# Patient Record
Sex: Female | Born: 1990 | Race: Black or African American | Hispanic: No | Marital: Single | Smoking: Never smoker
Health system: Southern US, Community
[De-identification: ages and names within clinical notes are randomized; demographics above are authoritative.]

## PROBLEM LIST (undated history)

## (undated) DIAGNOSIS — D649 Anemia, unspecified: Secondary | ICD-10-CM

---
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
## 2013-07-26 ENCOUNTER — Encounter: Payer: Self-pay | Admitting: Podiatry

## 2013-07-26 ENCOUNTER — Ambulatory Visit (INDEPENDENT_AMBULATORY_CARE_PROVIDER_SITE_OTHER): Payer: 59 | Admitting: Podiatry

## 2013-07-26 ENCOUNTER — Ambulatory Visit (INDEPENDENT_AMBULATORY_CARE_PROVIDER_SITE_OTHER): Payer: 59

## 2013-07-26 VITALS — BP 125/84 | HR 91 | Resp 16 | Ht 61.0 in | Wt 138.0 lb

## 2013-07-26 DIAGNOSIS — M21619 Bunion of unspecified foot: Secondary | ICD-10-CM

## 2013-07-26 NOTE — Progress Notes (Signed)
**Note Erika-Identified via Obfuscation**    Subjective:    Patient ID: Erika Frederick, female    DOB: [birth year], 23 y.o.   MRN: [id number]  HPI Comments: i have a bunion on my right foot. Ive had it for over 1 year. It has gotten worse. i have used a bunion remover on it. It helped a little while then the pain came back. It hurts to walk on it and have tennis shoes on.  Foot Pain      Review of Systems  All other systems reviewed and are negative.      Objective:   Physical Exam: I have reviewed her past medical history medications allergies surgeries social history and review of systems. Pulses are strongly palpable bilateral. Neurologic sensorium is intact per since once the monofilament. Deep tendon reflexes are intact bilateral muscle strength is 5 over 5 dorsiflexors plantar flexors inverters everters all intrinsic musculature is intact. Orthopedic evaluation demonstrates all joints distal to the ankle a full range of motion without crepitation. She does have pain on direct palpation of the dorsal lateral aspect of the first metatarsophalangeal joint and the medial aspect of the first metatarsal head distally associated with mild hallux abductovalgus deformity. Radiographic evaluation does demonstrate a very mild increase in the first metatarsal angle the joint space narrowing laterally and dorsal laterally indicative of a functional hallux limitus. Cutaneous evaluation demonstrates supple well hydrated cutis there is no erythema edema saline is drainage or odor.        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  Plan: We discussed the etiology pathology conservative versus surgical therapies. At this point she would like to have this surgically corrected with an Erika Frederick a type osteotomy. However she would like to have this done in December and we will followup with her in October for surgical consult regarding this right foot.

## 2013-12-27 ENCOUNTER — Ambulatory Visit (INDEPENDENT_AMBULATORY_CARE_PROVIDER_SITE_OTHER): Payer: 59 | Admitting: Podiatry

## 2013-12-27 VITALS — BP 124/77 | HR 73 | Resp 16

## 2013-12-27 DIAGNOSIS — M2011 Hallux valgus (acquired), right foot: Secondary | ICD-10-CM

## 2013-12-27 DIAGNOSIS — M21611 Bunion of right foot: Secondary | ICD-10-CM

## 2013-12-27 NOTE — Progress Notes (Signed)
She presents today for a surgical consult regarding her right foot. She has tried to tolerate this and has tried different shoe gear to no avail. She states that he continues to her in a starting to affect her ability to perform her daily routine including work. She like to have this surgically repaired at all possible.  Objective: Signs are stable she is alert and oriented x3. Pulses are strongly palpable right foot. Pain on palpation and sharp dorsiflexion of the first metatarsophalangeal joint of the right foot with mild to moderate increase in the first intermetatarsal angle upon reviewing radiographs. Radiographs also demonstrate a dorsal condyle that appears to be developing an early spur possibly associated with a functional hallux limitus. Hypermobility of the first metatarsal me cuneiform joint in the sagittal plane is evident.  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 Chronic pain right foot.  Plan: Discussed etiology pathology conservative versus surgical therapies. At this point we went over consent form today line bylined number by number giving her ample time to ask questions she saw fit regarding an [place] osteotomy with screw right foot. I answered all the questions regarding his procedures to the best of my ability in layman's terms. We discussed the possible postop complications which may include but are not limited to postop pain bleeding swelling infection recurrence of the similar condition over correction under correction need for further surgery loss of digit loss of limb loss of life. She was dispensed a Cam Walker today for her postop recovery. And I will followup with her the week of Christmas for surgical intervention.

## 2014-02-24 ENCOUNTER — Other Ambulatory Visit: Payer: Self-pay | Admitting: Podiatry

## 2014-02-24 MED ORDER — PROMETHAZINE HCL 25 MG PO TABS: 25.0000 mg | ORAL_TABLET | Freq: Three times a day (TID) | ORAL | Status: DC | PRN

## 2014-02-24 MED ORDER — CEPHALEXIN 500 MG PO CAPS: 500.0000 mg | ORAL_CAPSULE | Freq: Three times a day (TID) | ORAL | Status: DC

## 2014-02-24 MED ORDER — OXYCODONE-ACETAMINOPHEN 10-325 MG PO TABS: 1.0000 | ORAL_TABLET | Freq: Four times a day (QID) | ORAL | Status: DC | PRN

## 2014-02-26 ENCOUNTER — Telehealth: Payer: Self-pay | Admitting: *Deleted

## 2014-02-26 NOTE — Telephone Encounter (Signed)
Pt called states she needs to speak to someone about her pain level.

## 2014-02-26 NOTE — Telephone Encounter (Signed)
PT CALLED AND STATED SHE WAS SCHEDULED TO HAVE SURGERY TODAY BUT HAD TO CANCEL BECAUSE SHE IS PREGNANT. WANTED TO SCH AN [facility]. [facility]

## 2014-03-05 ENCOUNTER — Encounter: Payer: Self-pay | Admitting: Podiatry

## 2014-03-05 ENCOUNTER — Ambulatory Visit (INDEPENDENT_AMBULATORY_CARE_PROVIDER_SITE_OTHER): Payer: 59 | Admitting: Podiatry

## 2014-03-05 VITALS — BP 139/79 | HR 110 | Resp 16

## 2014-03-05 DIAGNOSIS — M779 Enthesopathy, unspecified: Secondary | ICD-10-CM

## 2014-03-05 DIAGNOSIS — M21611 Bunion of right foot: Secondary | ICD-10-CM

## 2014-03-05 DIAGNOSIS — M778 Other enthesopathies, not elsewhere classified: Secondary | ICD-10-CM

## 2014-03-05 DIAGNOSIS — M7751 Other enthesopathy of right foot: Secondary | ICD-10-CM

## 2014-03-05 DIAGNOSIS — M2011 Hallux valgus (acquired), right foot: Secondary | ICD-10-CM

## 2014-03-05 NOTE — Progress Notes (Signed)
She presents today for follow-up of her hallux abductovalgus deformity and states that her foot is exquisitely sore. She is currently pregnant during her first trimester and surgery was canceled for correction of her bunion deformity.  Objective: She still has pain on palpation first metatarsophalangeal joint of the right foot. No calf pain and pulses are strongly palpable.  Assessment: Hallux abductovalgus deformity right foot.  Plan: She was scanned for set of orthotics and will follow up with her once those come in.

## 2014-03-13 ENCOUNTER — Emergency Department: Payer: Self-pay | Admitting: Emergency Medicine

## 2014-03-13 LAB — URINALYSIS, COMPLETE
Bilirubin,UR: NEGATIVE
Blood: NEGATIVE
Glucose,UR: NEGATIVE mg/dL (ref 0–75)
Nitrite: NEGATIVE
PH: 5 (ref 4.5–8.0)
Protein: 30
Specific Gravity: 1.031 (ref 1.003–1.030)
Squamous Epithelial: 5
WBC UR: 12 /HPF (ref 0–5)

## 2014-03-13 LAB — CBC WITH DIFFERENTIAL/PLATELET
Basophil #: 0.1 10*3/uL (ref 0.0–0.1)
Basophil %: 0.4 %
EOS PCT: 0.1 %
Eosinophil #: 0 10*3/uL (ref 0.0–0.7)
HCT: 40.3 % (ref 35.0–47.0)
HGB: 12.7 g/dL (ref 12.0–16.0)
Lymphocyte #: 0.8 10*3/uL — ABNORMAL LOW (ref 1.0–3.6)
Lymphocyte %: 5.3 %
MCH: 26.1 pg (ref 26.0–34.0)
MCHC: 31.6 g/dL — ABNORMAL LOW (ref 32.0–36.0)
MCV: 83 fL (ref 80–100)
MONOS PCT: 3.7 %
Monocyte #: 0.5 x10 3/mm (ref 0.2–0.9)
NEUTROS PCT: 90.5 %
Neutrophil #: 13 10*3/uL — ABNORMAL HIGH (ref 1.4–6.5)
PLATELETS: 426 10*3/uL (ref 150–440)
RBC: 4.87 10*6/uL (ref 3.80–5.20)
RDW: 13.4 % (ref 11.5–14.5)
WBC: 14.4 10*3/uL — AB (ref 3.6–11.0)

## 2014-03-13 LAB — COMPREHENSIVE METABOLIC PANEL
ALT: 25 U/L
Albumin: 4 g/dL (ref 3.4–5.0)
Alkaline Phosphatase: 60 U/L
Anion Gap: 9 (ref 7–16)
BILIRUBIN TOTAL: 0.4 mg/dL (ref 0.2–1.0)
BUN: 8 mg/dL (ref 7–18)
CALCIUM: 8.9 mg/dL (ref 8.5–10.1)
CHLORIDE: 104 mmol/L (ref 98–107)
CO2: 24 mmol/L (ref 21–32)
CREATININE: 0.96 mg/dL (ref 0.60–1.30)
EGFR (Non-African Amer.): >60
Glucose: 125 mg/dL — ABNORMAL HIGH (ref 65–99)
Osmolality: 274 (ref 275–301)
Potassium: 3.5 mmol/L (ref 3.5–5.1)
SGOT(AST): 17 U/L (ref 15–37)
Sodium: 137 mmol/L (ref 136–145)
Total Protein: 7.8 g/dL (ref 6.4–8.2)

## 2014-03-13 LAB — HCG, QUANTITATIVE, PREGNANCY: BETA HCG, QUANT.: 39501 m[IU]/mL — AB

## 2014-03-28 ENCOUNTER — Ambulatory Visit: Payer: 59 | Admitting: Podiatry

## 2014-04-04 ENCOUNTER — Ambulatory Visit (INDEPENDENT_AMBULATORY_CARE_PROVIDER_SITE_OTHER): Payer: Commercial Managed Care - PPO | Admitting: Podiatry

## 2014-04-04 DIAGNOSIS — M2011 Hallux valgus (acquired), right foot: Secondary | ICD-10-CM

## 2014-04-04 DIAGNOSIS — M21611 Bunion of right foot: Secondary | ICD-10-CM

## 2014-04-04 NOTE — Progress Notes (Signed)
Pt presents for orthotic pick up ,written and verbal instructions are given  

## 2014-04-04 NOTE — Patient Instructions (Signed)

## 2014-05-02 ENCOUNTER — Ambulatory Visit: Payer: Self-pay | Admitting: Podiatry

## 2014-06-12 ENCOUNTER — Inpatient Hospital Stay
Admit: 2014-06-12 | Disposition: A | Discharge status: Home / Self Care | Payer: Self-pay | Attending: Obstetrics and Gynecology | Admitting: Obstetrics and Gynecology

## 2014-06-12 LAB — DIFFERENTIAL
Basophil #: 0 10*3/uL (ref 0.0–0.1)
Basophil %: 0.3 %
EOS ABS: 0.2 10*3/uL (ref 0.0–0.7)
Eosinophil %: 1.5 %
LYMPHS PCT: 21.7 %
Lymphocyte #: 3.3 10*3/uL (ref 1.0–3.6)
MONO ABS: 0.8 x10 3/mm (ref 0.2–0.9)
Monocyte %: 5.4 %
NEUTROS PCT: 71.1 %
Neutrophil #: 10.8 10*3/uL — ABNORMAL HIGH (ref 1.4–6.5)

## 2014-06-12 LAB — URINALYSIS, COMPLETE
Bilirubin,UR: NEGATIVE
GLUCOSE, UR: NEGATIVE mg/dL (ref 0–75)
NITRITE: NEGATIVE
Ph: 6 (ref 4.5–8.0)
Protein: NEGATIVE
RBC,UR: 2 /HPF (ref 0–5)
Specific Gravity: 1.015 (ref 1.003–1.030)
WBC UR: 4 /HPF (ref 0–5)

## 2014-06-12 LAB — CBC
HCT: 33.5 % — ABNORMAL LOW (ref 35.0–47.0)
HGB: 10.9 g/dL — ABNORMAL LOW (ref 12.0–16.0)
MCH: 26 pg (ref 26.0–34.0)
MCHC: 32.7 g/dL (ref 32.0–36.0)
MCV: 80 fL (ref 80–100)
Platelet: 402 10*3/uL (ref 150–440)
RBC: 4.21 10*6/uL (ref 3.80–5.20)
RDW: 12.9 % (ref 11.5–14.5)
WBC: 15.2 10*3/uL — AB (ref 3.6–11.0)

## 2014-06-12 LAB — HCG, QUANTITATIVE, PREGNANCY: BETA HCG, QUANT.: 28764 m[IU]/mL — AB

## 2014-06-13 LAB — CBC WITH DIFFERENTIAL/PLATELET
BASOS PCT: 0.1 %
Basophil #: 0 10*3/uL (ref 0.0–0.1)
EOS PCT: 0 %
Eosinophil #: 0 10*3/uL (ref 0.0–0.7)
HCT: 31.7 % — AB (ref 35.0–47.0)
HGB: 9.9 g/dL — ABNORMAL LOW (ref 12.0–16.0)
LYMPHS ABS: 1.1 10*3/uL (ref 1.0–3.6)
Lymphocyte %: 4.8 %
MCH: 24.9 pg — AB (ref 26.0–34.0)
MCHC: 31.3 g/dL — ABNORMAL LOW (ref 32.0–36.0)
MCV: 80 fL (ref 80–100)
MONOS PCT: 2.8 %
Monocyte #: 0.7 x10 3/mm (ref 0.2–0.9)
NEUTROS ABS: 21.6 10*3/uL — AB (ref 1.4–6.5)
Neutrophil %: 92.3 %
Platelet: 389 10*3/uL (ref 150–440)
RBC: 3.97 10*6/uL (ref 3.80–5.20)
RDW: 13 % (ref 11.5–14.5)
WBC: 23.4 10*3/uL — ABNORMAL HIGH (ref 3.6–11.0)

## 2014-06-15 LAB — URINE CULTURE

## 2014-07-02 LAB — SURGICAL PATHOLOGY

## 2014-07-08 NOTE — Discharge Summary (Signed)
Dates of Admission and Diagnosis:  Date of Admission 12-Jun-2014   Date of Discharge 01-Jan-0001   Allergies:  No Known Allergies:   Hospital Course:  Condition on Discharge Stable   DISCHARGE INSTRUCTIONS HOME MEDS:  Medication Reconciliation: Patient's Home Medications at Discharge:     Physician's Instructions:  Diet Regular   Electronic Signatures: Zerita BoersYates, Kelly (RN)  (Signed 06-Apr-16 16:35)  Authored: ADMISSION DATE AND DIAGNOSIS, Allergies, HOSPITAL COURSE, DISCHARGE INSTRUCTIONS HOME MEDS, PATIENT INSTRUCTIONS   Last Updated: 06-Apr-16 16:35 by Zerita BoersYates, Kelly (RN)

## 2014-07-17 NOTE — H&P (Signed)
L&D Evaluation:  History:  HPI -CC: vaginal spotting and cramping at 1800 today. cervical funneling found in the ER -HPI: 24 y/o G1 @ 19/4 ([facility] with the above CC. Preg c/b BMI 25.    Patient had some brief spotting and cramping at 1800 today, which was BRB and noticed when she went to the bathroom and wiped and saw some in the toilet. No VB or pain since then. No fetal quickening yet this pregnancy. Patient seen in the ER where u/s showed SLIUP at 20/4 weeks by BPD with CL of 4mm and funneling of membranes noted. Also, LL posterior placenta (1.1cm) was noted, normal FHR and subjectively normal fluid; I also reviewed the images. I had her transferred to L&D for toco monitoring and her pelvic exam. ER labs showed WBC of 15.2 with ANC of 10.8, normal plts and slightly anemia at Hct 33.5. U/a equivocal and blood type O positive. Patient afebrile on admission at 98.0   Patient's Medical History No Chronic Illness   Patient's Surgical History none   Medications Pre Natal Vitamins  Tylenol (Acetaminophen)   Allergies NKDA   Social History none  married. dental assistant   Family History Non-Contributory   ROS:  ROS No dysuria, fevers, chills, dysuria, LOF, chest pain, SOB, nausea, vomiting   Exam:  Vital Signs 134/90 on admission to ER and HR 107 and then BP 124/78, HR 90s. all other VS normal and stable.   General no apparent distress   Mental Status clear   Chest ctab   Heart rrr no mrgs   Abdomen gravid, NTTP, approximately 20wk uterus   Back no CVAT   Pelvic SSE: BBOW with 25mL of old blood clot in the vault. speculum removed once BBOW seen, so cervix at least 3-4cm. no active bleeding   Mebranes Intact   Ucx no definite UCs noted. +irritability   Other wet prep: not done due to blood   Impression:  Impression advanced cervical dilation vs PTL. Pt currently stable   Plan:  Comments *IUP: normal FHR  *Asymptomatic advanced cx dilation vs PTL with  inevitable AB: difficult situation d/w patient and family. I told them that I don't believe a rescue cerclage is indicated based on her bleeding, changing cervix and elevated white count with left shift; also, her uterus is showing some irritability, as well. I told her that based on her findings, she has asymptomatic cervical dilation and that her elevated wbc could also be from some occult infection and that it's difficult to know which came first: the cx dilation or the infection. based on this, and after d/w Dr. Leone Frederick [facility] attending) who is also in agreement, I told them that cerclage isn't indicated in this circumstance and that she could either do inpatient expectant management to see if her cervix continues to change and she starts to labor and delivers, then that would cure any occult infection and with her inevitable AB; I told them this is an option since she is clinically stable with no signs of worsening infection. I also told her that given that possible and likely occult infection is suspected that augmenting her labor to help speed up removal of the infected products of conception is also an option. In either circumstance, I told them of the risk for need for d&c for retained POCs. They are to think about it and I will follow up with them. If they elect to do expectant management, I would favor keeping them inpatient  and trending her wbc count. Images reviewed by me and I don't feel that there is a LL placenta and what they are measuring artifact -no need for thrombophilia work up, per Dr. Leone Frederick -possible genetic testing brought up to patient, but d/w them likely low yield, if fetus appears normal, given low risk factors -f/u UCx and GC/CT -sickle cell testing normal -NT scan negative but no serum analytes done. Pt was for anatomy scan later this week in the office.  *FEN/GI: s/p IVF bolus in ER. continue clears and MIVF. *Analgesia: no needs *Dispo: (p)   Electronic  Signatures: Erika Frederick, Erika Frederick (MD)  (Signed 06-Apr-16 00:43)  Authored: L&D Evaluation   Last Updated: 06-Apr-16 00:43 by Erika Frederick, Erika Frederick (MD)

## 2014-07-25 ENCOUNTER — Other Ambulatory Visit: Payer: Self-pay | Admitting: Obstetrics and Gynecology

## 2014-07-25 DIAGNOSIS — R1011 Right upper quadrant pain: Secondary | ICD-10-CM

## 2014-07-25 DIAGNOSIS — R140 Abdominal distension (gaseous): Secondary | ICD-10-CM

## 2014-07-26 ENCOUNTER — Ambulatory Visit
Admission: RE | Admit: 2014-07-26 | Discharge: 2014-07-26 | Disposition: A | Discharge status: Home / Self Care | Payer: Medicaid Other | Source: Ambulatory Visit | Attending: Obstetrics and Gynecology | Admitting: Obstetrics and Gynecology

## 2014-07-26 VITALS — BP 134/92 | HR 102 | Temp 98.5°F | Ht 61.0 in | Wt 136.0 lb

## 2014-07-26 DIAGNOSIS — Z8759 Personal history of other complications of pregnancy, childbirth and the puerperium: Secondary | ICD-10-CM

## 2014-07-26 HISTORY — DX: Anemia, unspecified: D64.9

## 2014-07-26 NOTE — Progress Notes (Signed)
[facility] Consultation   Chief Complaint: Preconception consult due to history of 19 week loss  HPI: Ms. Erika Frederick is a 24 y.o. G1P0010 who presents in consultation from  [facility] to discuss her recent history of a 19 week loss on June 12, 2014.  Per her report, the pregnancy, her first, was uncomplicated.  She had early prenatal care and had 2 ultrasounds in the first trimester.  She had NT measurement but no serum analytes were drawn as a component of her first trimester screen.  Two weeks prior to the day of her delivery, she presented with complaints of pelvic pressure and had a urine culture performed which was negative.  On the day of her delivery (2 days prior to her scheduled anatomy scan at 20 weeks), she went to the bathroom and noticed blood in the toilet as well as on the toilet paper.  She reported cramping as well and quickly presented to the hospital.  An ultrasound was performed and fetal heart rate was documented but her cervix was noted to be funneled.  A SVE per her report was 1 cm.  A few hours later, she was 4-5 cm and she was augmented and soon progressed to complete and delivered a stillborn female and placenta "all at once".  She was discharged home that same day.  Per the H&P that day, she also had an elevated wbc and uterine irritability.  She was offered and declined karyotyping.  Placental pathology was reported as follows: "SECOND TRIMESTER DISCOID PLACENTA, 134 GRAMS (<50TH PERCENTILE FOR  ESTIMATED GESTATIONAL AGE OF [redacted] WEEKS).  - PARENCHYMAL/SUBCHORIONIC HEMORRHAGE COMPRISING 20% OF PLACENTAL  SURFACE WITH FOCAL ASSOCIATED VILLOUS INFARCTION.  - THREE-VESSEL UMBILICAL CORD WITH ECCENTRIC INSERTION.  - NEGATIVE FOR FUNISITIS AND CHORIOAMNIONITIS." Adherent blood clot was noted on about 20% of the placental surface.  Past Medical History: Anemia (hemoglobin electrophoresis was normal) Past Surgical History:  none Obstetric  History:  OB History    Gravida Para Term Preterm AB TAB SAB Ectopic Multiple Living   1    1  1    0     Gynecologic History:  LMP 07/15/2014 Menses are regular every 28-30 days Flow: flow is moderate Problems with menses: no Hx of abnormal pap smears: no  Medications: iron Allergies: Patient has No Known Allergies.  Social History: Patient  reports that she has never smoked. She does not have any smokeless tobacco history on file. She reports that she does not drink alcohol or use illicit drugs. She works as a Armed forces operational officerdental hygienist; is single but in a relationship. Family History:  Mother with HTN and DM; mother also experience a loss due to bleeding and PTL at about 7-8 months of pregnancy Review of Systems A full 12 point review of systems was negative or as noted in the History of Present Illness.  Physical Exam: BP 134/92 mmHg  Pulse 102  Temp(Src) 98.5 F (36.9 C) (Oral)  Ht 5\' 1"  (1.549 m)  Wt 136 lb (61.689 kg)  BMI 25.71 kg/m2  SpO2 100%   Asessement: 1.  History of 19 weeks loss possible due to abruption, cannot rule out incompetent cervix  Plan: I spent about 60 minutes reviewing Ms. Erika Frederick's history.  Given her history of bleeding followed by cramping and cervical dilation, in addition to results of the placental pathology which demonstrated both clot and villous infarction, I suspect that her presentation may be related to an abruption rather than cervical  incompetence although the latter diagnosis may have played a role.  We discussed options for management in a subsequent pregnancy which include prophylactic cerclage versus cervical length monitoring starting at 16 weeks with reflex to cerclage if there were to be evidence of cervical shortening.  I would lean toward the latter option, especially as the placental pathology and her history is more suggestive of placental abruption.  We also discussed the option of sending an antiphospholipid antibody panel (ordered today).   SIS could be considered given the lack of obvious precipitating etiologies that are most common with abruption (hypertension, trauma, drug use, fibroids).  Ms. Erika Frederick had an opportunity to ask questions and appeared to be grieving appropriately.    Of note, BP was mildly elevated today - she admits to having white coat hypertension and has no prior history of hypertension.  Total time spent with the patient was 60 minutes with greater than 50% spent in counseling and coordination of care. We appreciate this interesting consult and will be happy to be involved in the ongoing care of Ms. Erika Frederick in anyway her obstetricians desire.  Kirby FunkSarah Davied Nocito, MD Maternal-Fetal Medicine [facility]

## 2014-07-27 LAB — BETA 2 MICROGLOBULIN, SERUM: BETA 2 MICROGLOBULIN: 1.3 mg/L (ref 0.6–2.4)

## 2014-07-28 LAB — CARDIOLIPIN ANTIBODIES, IGG, IGM, IGA
Anticardiolipin IgA: <9 APL U/mL (ref 0–11)
Anticardiolipin IgM: <9 MPL U/mL (ref 0–12)

## 2014-07-31 LAB — LUPUS ANTICOAGULANT PANEL
DRVVT: 25.1 s (ref 0.0–55.1)
PTT LA: 42.2 s (ref 0.0–50.0)

## 2014-08-01 ENCOUNTER — Ambulatory Visit
Admission: RE | Admit: 2014-08-01 | Discharge: 2014-08-01 | Disposition: A | Discharge status: Home / Self Care | Payer: Medicaid Other | Source: Ambulatory Visit | Attending: Obstetrics and Gynecology | Admitting: Obstetrics and Gynecology

## 2014-08-01 DIAGNOSIS — R140 Abdominal distension (gaseous): Secondary | ICD-10-CM

## 2014-08-01 DIAGNOSIS — R1011 Right upper quadrant pain: Secondary | ICD-10-CM | POA: Insufficient documentation

## 2015-01-21 ENCOUNTER — Ambulatory Visit (INDEPENDENT_AMBULATORY_CARE_PROVIDER_SITE_OTHER): Payer: 59

## 2015-01-21 ENCOUNTER — Encounter: Payer: Self-pay | Admitting: Podiatry

## 2015-01-21 ENCOUNTER — Ambulatory Visit (INDEPENDENT_AMBULATORY_CARE_PROVIDER_SITE_OTHER): Payer: 59 | Admitting: Podiatry

## 2015-01-21 VITALS — BP 130/78 | HR 94 | Resp 16

## 2015-01-21 DIAGNOSIS — M2011 Hallux valgus (acquired), right foot: Secondary | ICD-10-CM

## 2015-01-21 NOTE — Progress Notes (Signed)
She presents today for follow-up of her bunion deformity right foot. It has been approximately one year since I have seen her and she states that she still has some tenderness along the medial aspect of the first metatarsophalangeal joint right foot. She states it is just occasionally painful and she notices a difference with particular shoe gear.  Objective: Vital signs are stable she is alert and oriented times 3. I have reviewed her past medical history medications allergy surgeries and social history. Pulses are strongly palpable neurologic sensorium is intact. Deep tendon reflexes are intact and muscle strength is normal bilateral. Right foot demonstrates mild hallux abductovalgus deformity of the right foot and tailor's bunion deformity. She has mild neuritis. Radiographs taken today do not demonstrate a high IM angle of the first metatarsal and second metatarsals. However there does appear to be a slightly hypertrophic medial condyle possibly resulting in her symptoms. She also has mild tailor's bunion deformity.  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  Plan: Discussed etiology pathology conservative versus surgical therapies. We discussed appropriate shoe gear stretching size ice therapy. I will follow-up with her in 1 year or sooner if needed for surgical reevaluation and consideration.  Arbutus Pedodd Nocholas Damaso DPM

## 2015-11-12 IMAGING — US US ABDOMEN LIMITED
1 series · 14 of 25 positions shown · non-contrast
Comparison: None.

CLINICAL DATA: Abdominal bloating for 1 month.

EXAM:
US ABDOMEN LIMITED - RIGHT UPPER QUADRANT

[Series 1: us abdomen limited · 0.18mm/px · 14 of 125 slices shown]
[im 1/125]
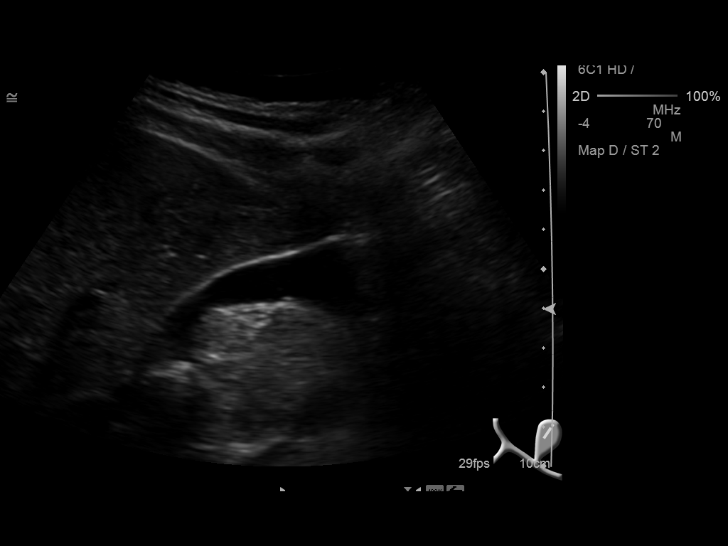
[im 11/125]
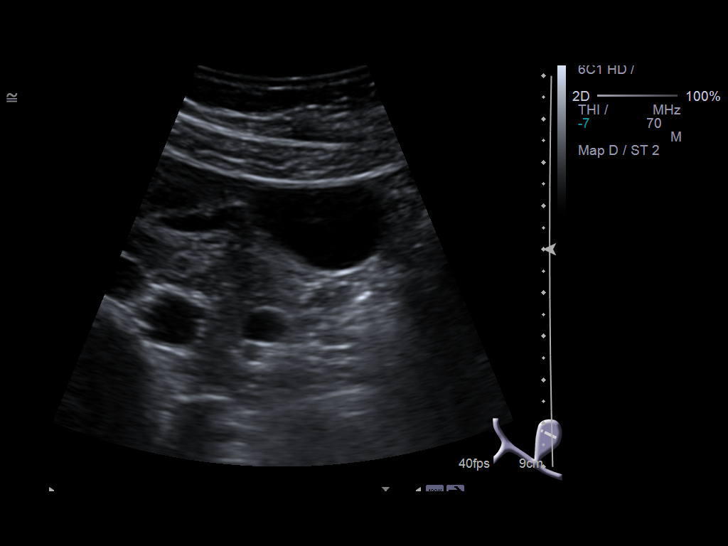
[im 21/125]
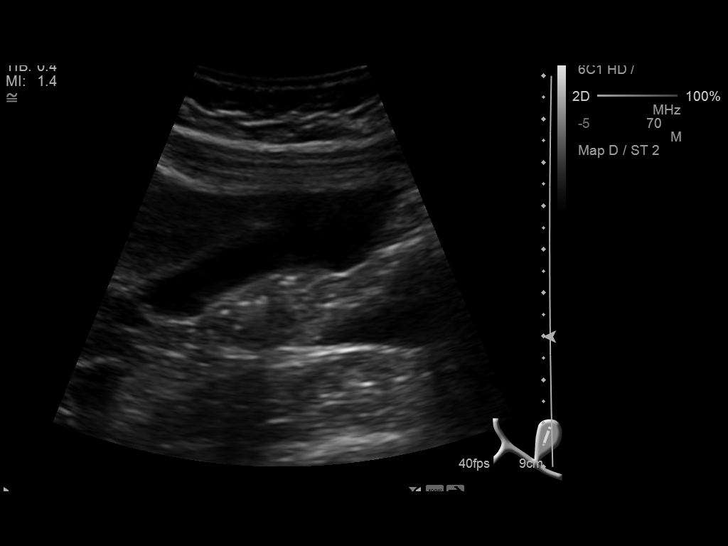
[im 32/125]
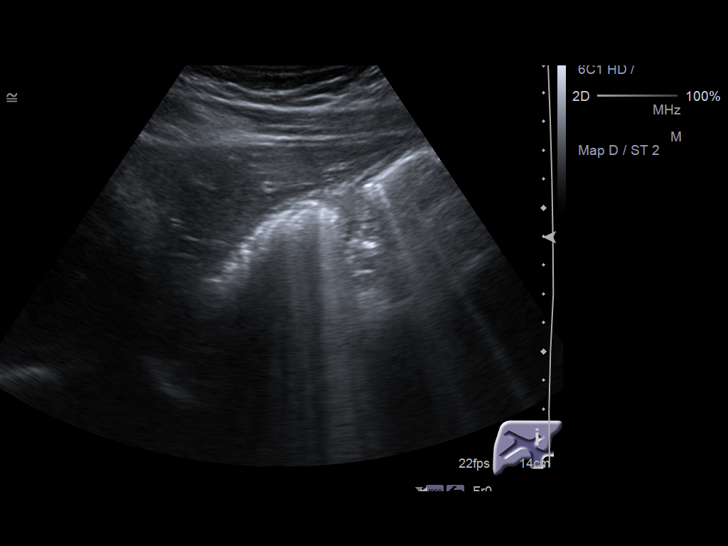
[im 42/125]
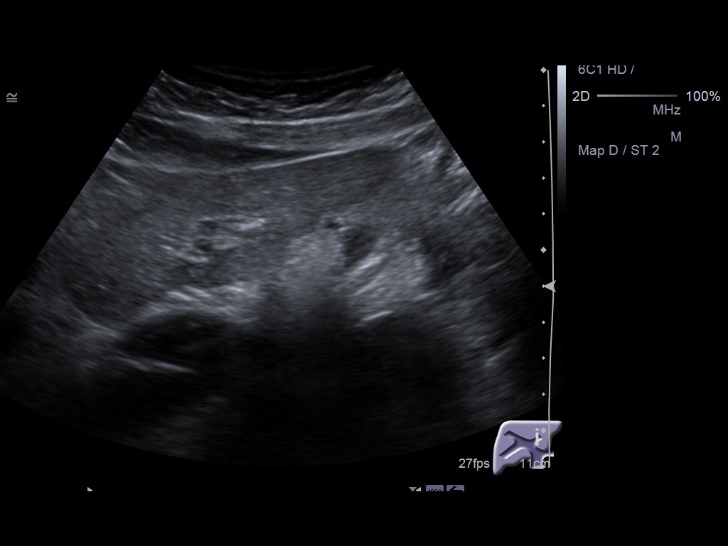
[im 47/125]
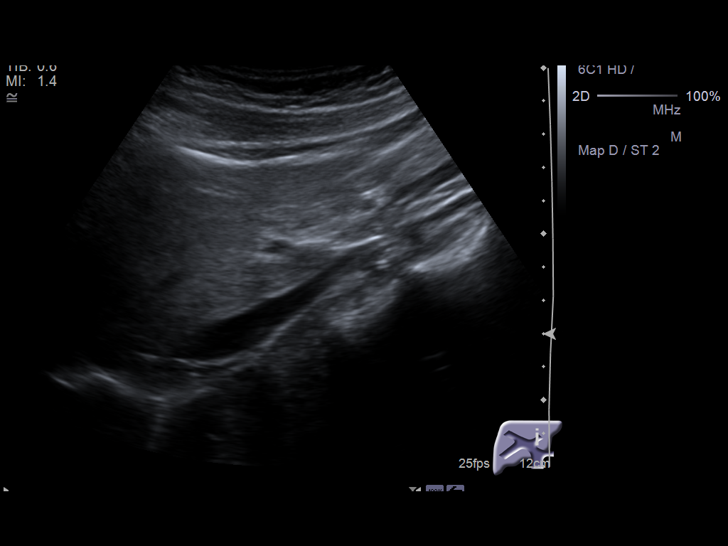
[im 57/125]
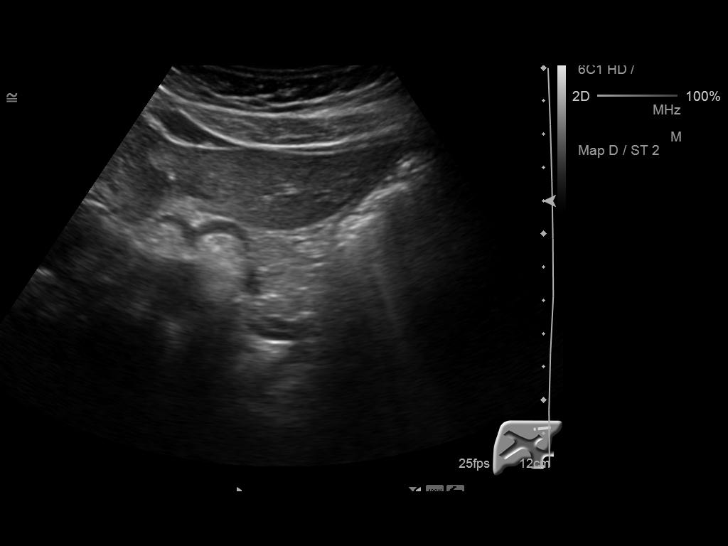
[im 68/125]
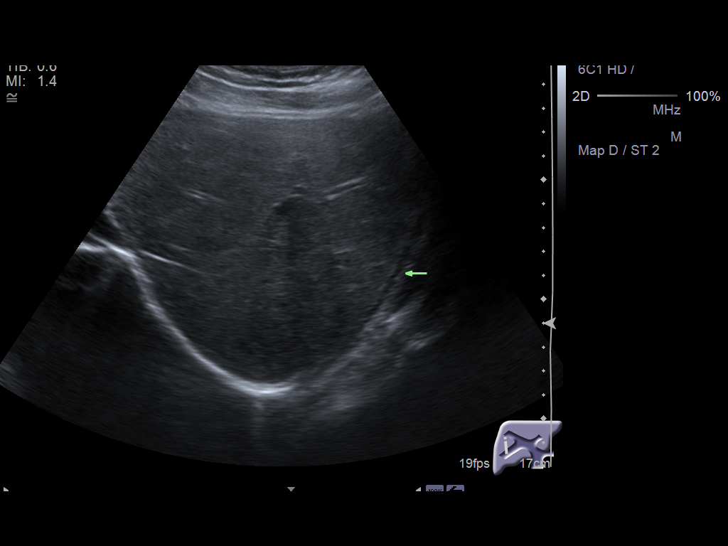
[im 78/125]
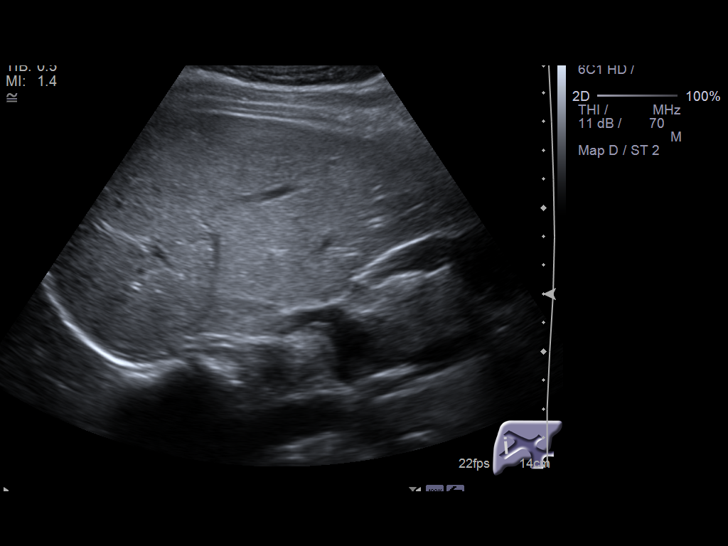
[im 83/125]
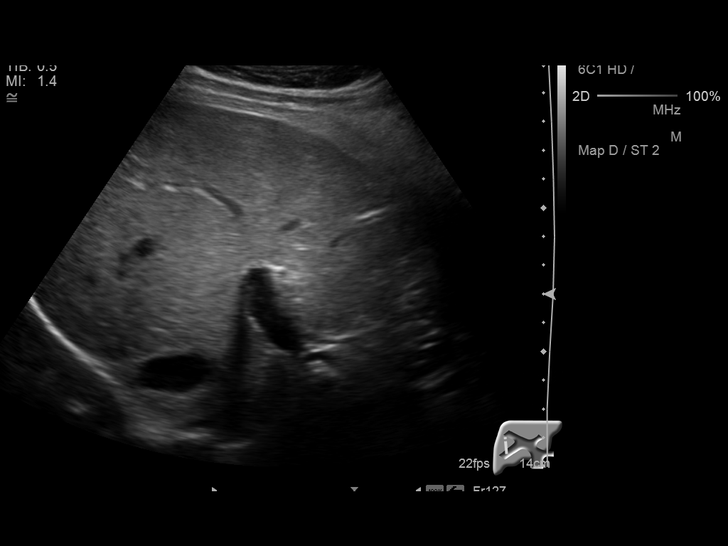
[im 94/125]
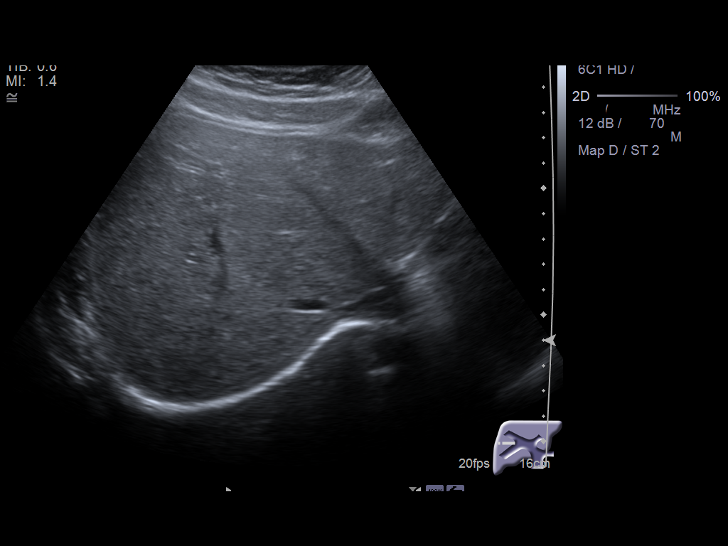
[im 104/125]
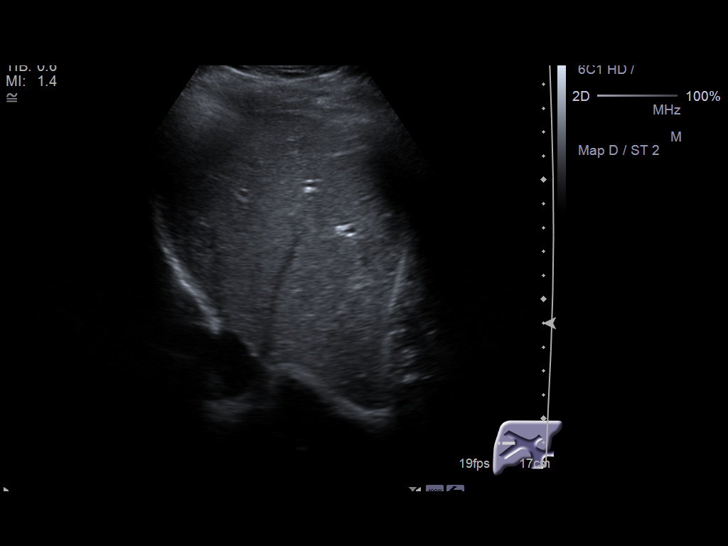
[im 114/125]
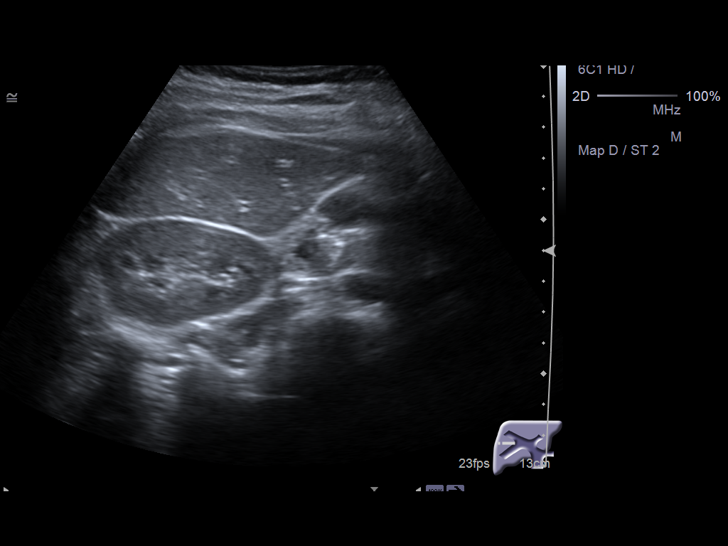
[im 125/125]
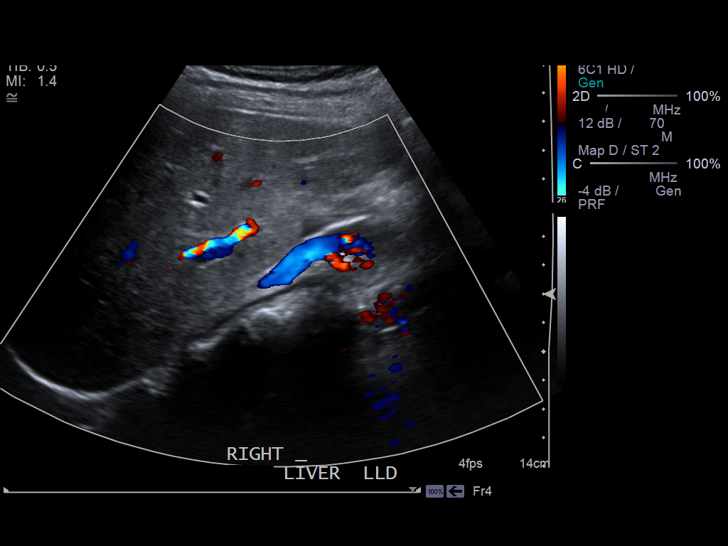

[14 of 25 positions shown; findings below may reference images not displayed]

FINDINGS: Gallbladder:

No gallstones or wall thickening visualized. No sonographic Murphy
sign noted.

Common bile duct:

Diameter: Normal caliber, 2 mm

Liver:

No focal lesion identified. Within normal limits in parenchymal
echogenicity.
IMPRESSION: Unremarkable right upper quadrant ultrasound.

## 2015-12-04 ENCOUNTER — Encounter: Payer: Self-pay | Admitting: Podiatry

## 2016-01-22 ENCOUNTER — Ambulatory Visit: Payer: 59 | Admitting: Podiatry

## 2019-02-01 ENCOUNTER — Other Ambulatory Visit: Payer: Self-pay

## 2019-02-01 DIAGNOSIS — Z20822 Contact with and (suspected) exposure to covid-19: Secondary | ICD-10-CM

## 2019-02-03 LAB — NOVEL CORONAVIRUS, NAA: SARS-CoV-2, NAA: NOT DETECTED

## 2020-03-06 ENCOUNTER — Other Ambulatory Visit: Payer: Self-pay
